# Patient Record
Sex: Male | Born: 2000 | Race: White | Hispanic: No | Marital: Single | Smoking: Never smoker
Health system: Southern US, Community
[De-identification: ages and names within clinical notes are randomized; demographics above are authoritative.]

## PROBLEM LIST (undated history)

## (undated) ENCOUNTER — Ambulatory Visit: Source: Home / Self Care

## (undated) DIAGNOSIS — H6690 Otitis media, unspecified, unspecified ear: Secondary | ICD-10-CM

## (undated) DIAGNOSIS — J302 Other seasonal allergic rhinitis: Secondary | ICD-10-CM

## (undated) DIAGNOSIS — L309 Dermatitis, unspecified: Secondary | ICD-10-CM

## (undated) HISTORY — PX: APPENDECTOMY: SHX54

## (undated) HISTORY — PX: OTHER SURGICAL HISTORY: SHX169

## (undated) HISTORY — PX: CIRCUMCISION: SHX1350

---
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
## 2022-08-20 IMAGING — US US ABDOMEN COMPLETE
1 series · 13 of 25 positions shown · non-contrast
Comparison: CT abdomen and pelvis 07/13/2015

CLINICAL DATA: Abdominal pain.  Dyspepsia.

EXAM:
ABDOMEN ULTRASOUND COMPLETE

[Series 1: us abdomen complete · 13 of 99 slices shown]
[im 1/99]
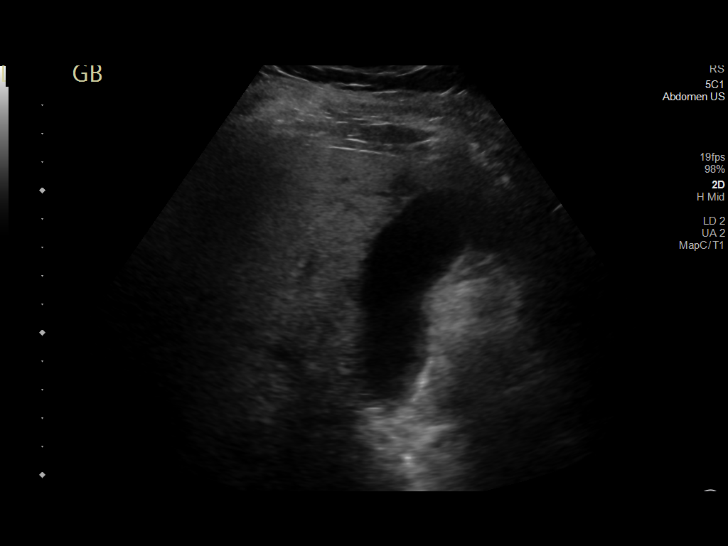
[im 9/99]
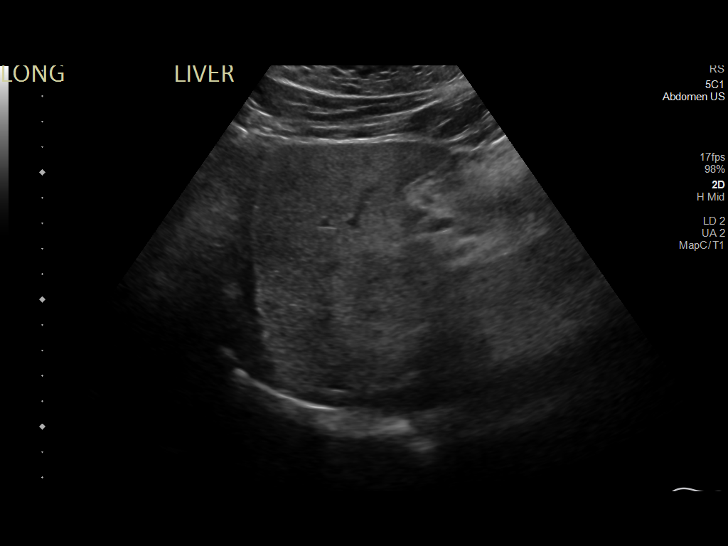
[im 17/99]
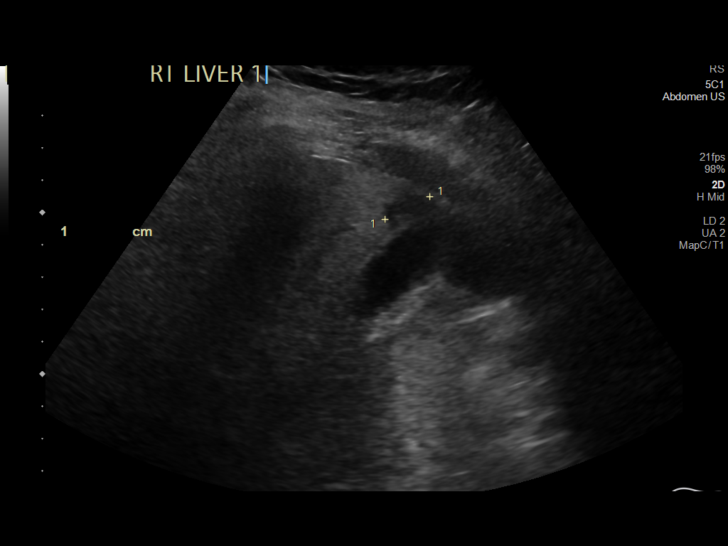
[im 25/99]
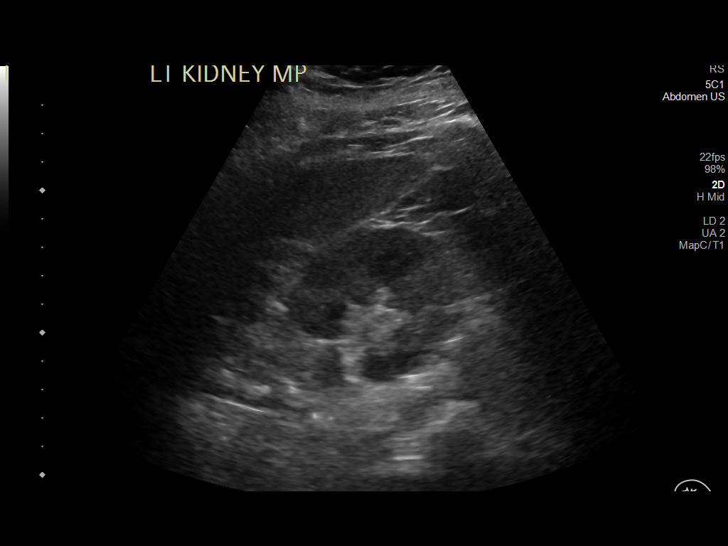
[im 33/99]
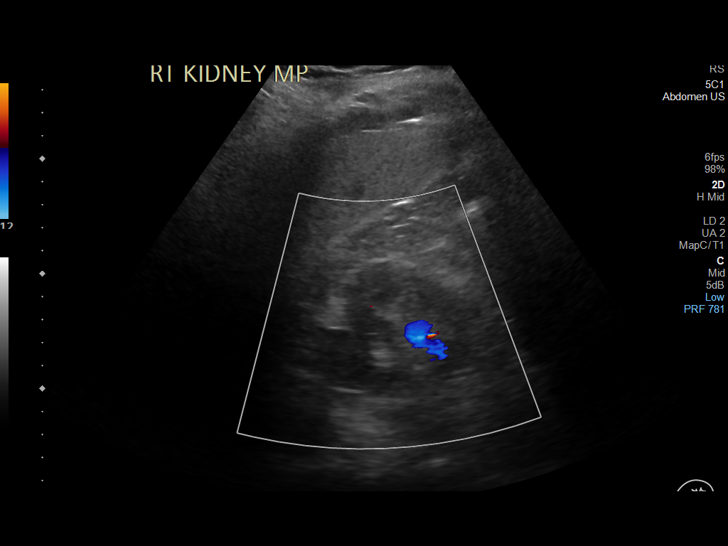
[im 41/99]
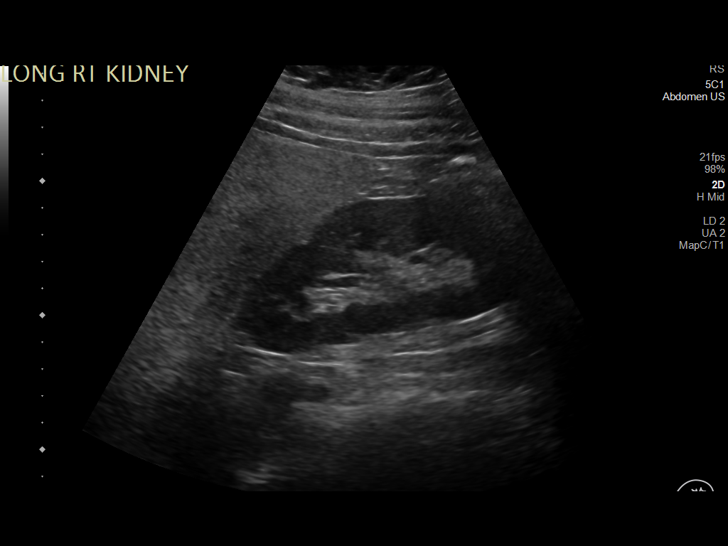
[im 50/99]
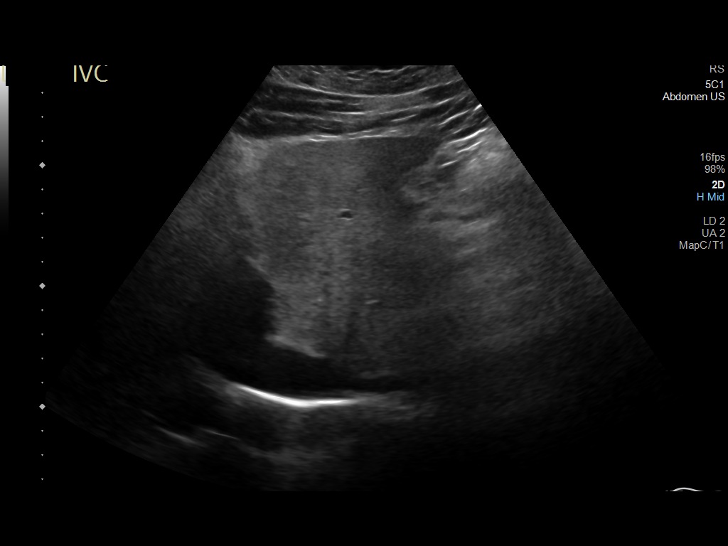
[im 58/99]
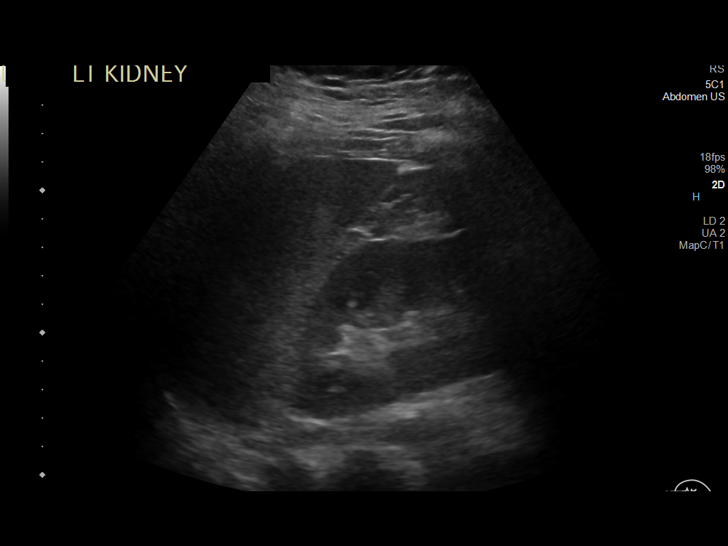
[im 66/99]
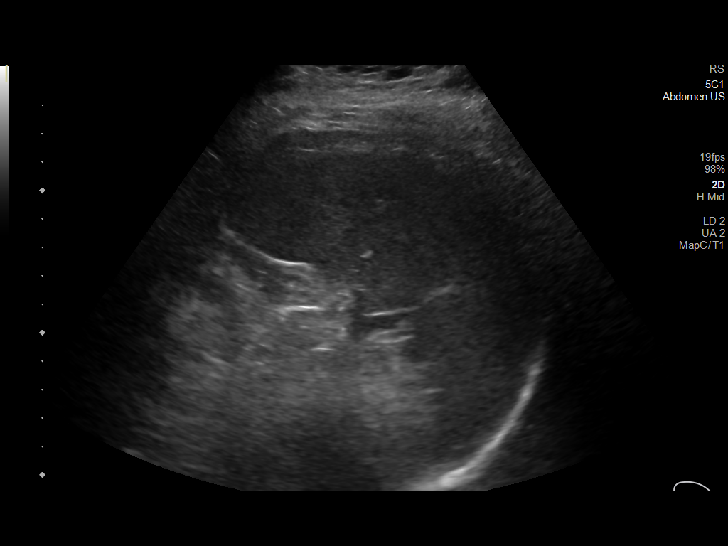
[im 74/99]
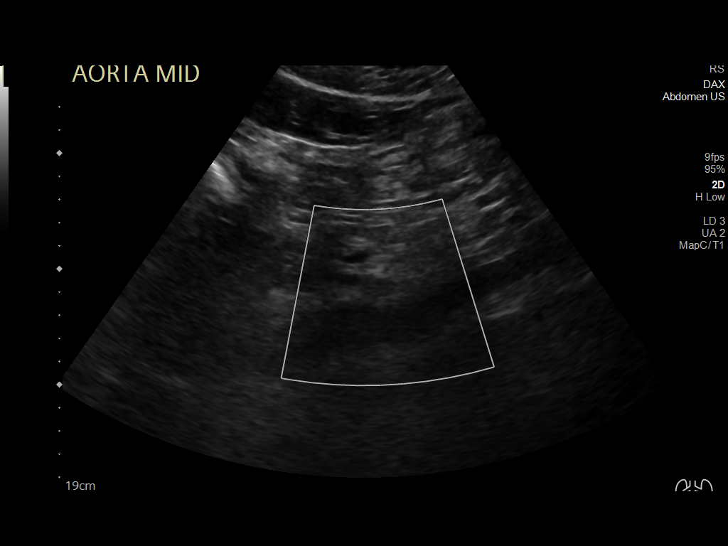
[im 82/99]
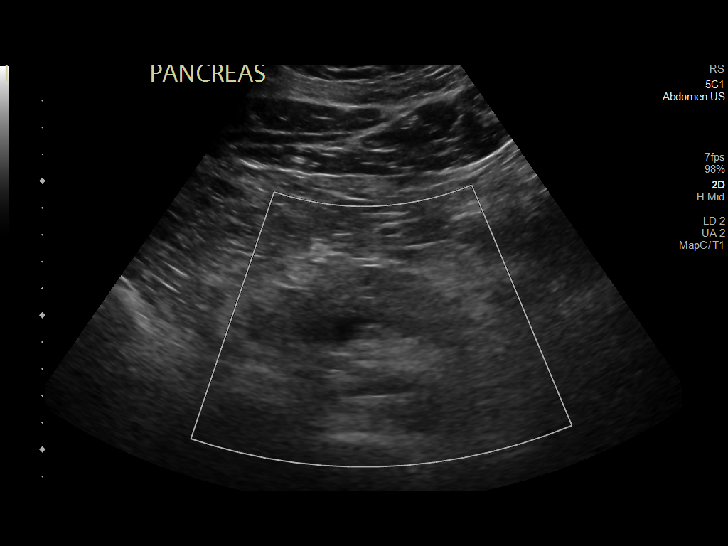
[im 90/99]
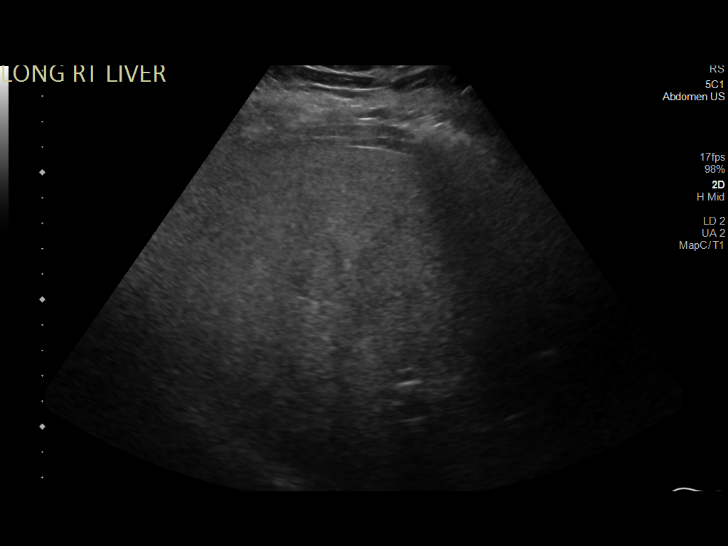
[im 99/99]
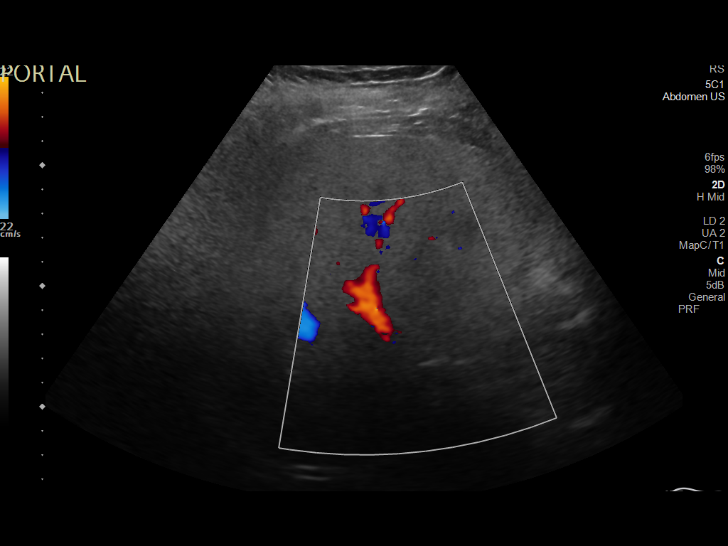

[13 of 25 positions shown; findings below may reference images not displayed]

FINDINGS: Decreased resolution due to large body habitus.

Gallbladder: No gallstones or wall thickening visualized. No
sonographic Murphy sign noted by sonographer.

Common bile duct: Diameter: 4 mm

Liver: Prominently increased parenchymal echogenicity diffusely
likely reflecting steatosis. Small hypoechoic focus adjacent to the
gallbladder without abnormal vascularity on color Doppler imaging
likely reflecting fatty sparing. Portal vein is patent on color
Doppler imaging with normal direction of blood flow towards the
liver.

IVC: Not visualized due to bowel gas.

Pancreas: Suboptimally visualized.  Visualized portion unremarkable.

Spleen: Size within normal limits. Mildly inhomogeneous echotexture
without a focal lesion identified.

Right Kidney: Length: 11.3 cm. Echogenicity within normal limits. No
mass or hydronephrosis visualized.

Left Kidney: Length: 10.8 cm. Echogenicity within normal limits. No
mass or hydronephrosis visualized.

Abdominal aorta: No aneurysm visualized. Aortic bifurcation obscured
by bowel gas.

Other findings: None.
IMPRESSION: 1. Hepatic steatosis.
2. Otherwise largely unremarkable abdominal ultrasound within above
described limitations.

## 2023-11-20 ENCOUNTER — Emergency Department (HOSPITAL_COMMUNITY)
Admission: EM | Admit: 2023-11-20 | Discharge: 2023-11-20 | Disposition: A | Discharge status: Home / Self Care | Attending: Emergency Medicine | Admitting: Emergency Medicine

## 2023-11-20 ENCOUNTER — Emergency Department (HOSPITAL_COMMUNITY)

## 2023-11-20 ENCOUNTER — Encounter (HOSPITAL_COMMUNITY): Payer: Self-pay | Admitting: Emergency Medicine

## 2023-11-20 ENCOUNTER — Other Ambulatory Visit: Payer: Self-pay

## 2023-11-20 DIAGNOSIS — R109 Unspecified abdominal pain: Secondary | ICD-10-CM | POA: Diagnosis present

## 2023-11-20 DIAGNOSIS — Z79899 Other long term (current) drug therapy: Secondary | ICD-10-CM | POA: Diagnosis not present

## 2023-11-20 DIAGNOSIS — U071 COVID-19: Secondary | ICD-10-CM | POA: Insufficient documentation

## 2023-11-20 DIAGNOSIS — R112 Nausea with vomiting, unspecified: Secondary | ICD-10-CM

## 2023-11-20 LAB — CBC
HCT: 45.3 % (ref 39.0–52.0)
Hemoglobin: 15.2 g/dL (ref 13.0–17.0)
MCH: 28.3 pg (ref 26.0–34.0)
MCHC: 33.6 g/dL (ref 30.0–36.0)
MCV: 84.2 fL (ref 80.0–100.0)
Platelets: 227 10*3/uL (ref 150–400)
RBC: 5.38 MIL/uL (ref 4.22–5.81)
RDW: 11.7 % (ref 11.5–15.5)
WBC: 5.9 10*3/uL (ref 4.0–10.5)
nRBC: 0 % (ref 0.0–0.2)

## 2023-11-20 LAB — URINALYSIS, ROUTINE W REFLEX MICROSCOPIC
Bilirubin Urine: NEGATIVE
Glucose, UA: NEGATIVE mg/dL
Hgb urine dipstick: NEGATIVE
Ketones, ur: NEGATIVE mg/dL
Leukocytes,Ua: NEGATIVE
Nitrite: NEGATIVE
Protein, ur: NEGATIVE mg/dL
Specific Gravity, Urine: 1.024 (ref 1.005–1.030)
pH: 5 (ref 5.0–8.0)

## 2023-11-20 LAB — COMPREHENSIVE METABOLIC PANEL
ALT: 59 U/L — ABNORMAL HIGH (ref 0–44)
AST: 36 U/L (ref 15–41)
Albumin: 4.4 g/dL (ref 3.5–5.0)
Alkaline Phosphatase: 45 U/L (ref 38–126)
Anion gap: 12 (ref 5–15)
BUN: 11 mg/dL (ref 6–20)
CO2: 20 mmol/L — ABNORMAL LOW (ref 22–32)
Calcium: 9.8 mg/dL (ref 8.9–10.3)
Chloride: 103 mmol/L (ref 98–111)
Creatinine, Ser: 0.77 mg/dL (ref 0.61–1.24)
GFR, Estimated: >60 mL/min (ref >60)
Glucose, Bld: 100 mg/dL — ABNORMAL HIGH (ref 70–99)
Potassium: 3.8 mmol/L (ref 3.5–5.1)
Sodium: 135 mmol/L (ref 135–145)
Total Bilirubin: 0.9 mg/dL (ref 0.0–1.2)
Total Protein: 7.8 g/dL (ref 6.5–8.1)

## 2023-11-20 LAB — SARS CORONAVIRUS 2 BY RT PCR: SARS Coronavirus 2 by RT PCR: POSITIVE — AB

## 2023-11-20 LAB — LIPASE, BLOOD: Lipase: 25 U/L (ref 11–51)

## 2023-11-20 MED ORDER — ONDANSETRON 4 MG PO TBDP: 4.0000 mg | ORAL_TABLET | Freq: Three times a day (TID) | ORAL | 0 refills | Status: DC | PRN

## 2023-11-20 MED ORDER — ONDANSETRON HCL 4 MG/2ML IJ SOLN
4.0000 mg | Freq: Once | INTRAMUSCULAR | Status: AC
  Administered 2023-11-20: 4 mg via INTRAVENOUS
  Filled 2023-11-20: qty 2

## 2023-11-20 MED ORDER — SODIUM CHLORIDE 0.9 % IV BOLUS
1000.0000 mL | Freq: Once | INTRAVENOUS | Status: AC
  Administered 2023-11-20: 1000 mL via INTRAVENOUS

## 2023-11-20 NOTE — ED Provider Notes (Signed)
 [place] EMERGENCY DEPARTMENT AT [facility] Provider Note   CSN: [id number] Arrival date & time: 11/20/23  1618    History  Chief Complaint  Patient presents with   Abdominal Pain   Emesis   Diarrhea    Gregory Warner is a 23 y.o. male here for evaluation of not feeling well.  Patient with nausea and vomiting, diarrhea which started this morning.  Has some burning in the chest after the vomiting.  Took Zofran this morning.  He is around someone who had COVID.  No hemoptysis, bloody stool.  No chest pain, shortness of breath, abd pain, weakness.  HPI     Home Medications Prior to Admission medications   Medication Sig Start Date End Date Taking? Authorizing Provider  ondansetron (ZOFRAN-ODT) 4 MG disintegrating tablet Take 1 tablet (4 mg total) by mouth every 8 (eight) hours as needed. 11/20/23  Yes Sherl Yzaguirre A, PA-C  ADVAIR DISKUS 100-50 MCG/DOSE AEPB Take 1 puff by mouth every 12 (twelve) hours. 07/03/15   [provider]  albuterol (PROVENTIL HFA;VENTOLIN HFA) 108 (90 BASE) MCG/ACT inhaler Inhale 2 puffs into the lungs every 4 (four) hours as needed for shortness of breath.     [provider]  albuterol (PROVENTIL HFA;VENTOLIN HFA) 108 (90 Base) MCG/ACT inhaler Inhale 2 puffs into the lungs every 4 (four) hours as needed for wheezing or shortness of breath. 02/18/16   Lowanda Foster, NP  cetirizine (ZYRTEC) 10 MG tablet Take 10 mg by mouth at bedtime.    [provider]  diphenhydrAMINE (BENADRYL) 25 mg capsule Take 1 capsule (25 mg total) by mouth every 6 (six) hours as needed for itching. Take 1 po q6 hours for the next 2-3 days then space out to an as needed basis Patient not taking: Reported on 07/13/2015 06/26/13   Phillis Haggis, MD  HYDROcodone-acetaminophen (NORCO/VICODIN) 5-325 MG tablet Take 1-2 tablets by mouth every 6 (six) hours as needed for moderate pain. 07/14/15   Leonia Corona, MD  ibuprofen (ADVIL,MOTRIN) 600 MG  tablet Take 1 tablet (600 mg total) by mouth every 6 (six) hours as needed for mild pain. 02/18/16   Lowanda Foster, NP  predniSONE (DELTASONE) 20 MG tablet Take 2 tablets (40 mg total) by mouth daily. Patient not taking: Reported on 07/13/2015 06/26/13   Phillis Haggis, MD      Allergies    Patient has no known allergies.    Review of Systems   Review of Systems  Constitutional: Negative.   HENT: Negative.    Respiratory: Negative.    Cardiovascular: Negative.   Gastrointestinal:  Positive for diarrhea, nausea and vomiting.  Genitourinary: Negative.   Musculoskeletal: Negative.   Skin: Negative.   Neurological: Negative.   All other systems reviewed and are negative.   Physical Exam Updated Vital Signs BP (!) 142/50   Pulse 98   Temp 98.3 F (36.8 C) (Oral)   Resp 20   SpO2 96%  Physical Exam Vitals and nursing note reviewed.  Constitutional:      General: He is not in acute distress.    Appearance: He is well-developed. He is not ill-appearing, toxic-appearing or diaphoretic.  HENT:     Head: Atraumatic.  Eyes:     Pupils: Pupils are equal, round, and reactive to light.  Cardiovascular:     Rate and Rhythm: Normal rate and regular rhythm.     Heart sounds: Normal heart sounds.  Pulmonary:     Effort: Pulmonary effort  is normal. No respiratory distress.     Breath sounds: Normal breath sounds.  Abdominal:     General: Bowel sounds are normal. There is no distension.     Palpations: Abdomen is soft.     Tenderness: There is no abdominal tenderness.     Hernia: No hernia is present.  Musculoskeletal:        General: Normal range of motion.     Cervical back: Normal range of motion and neck supple.  Skin:    General: Skin is warm and dry.  Neurological:     General: No focal deficit present.     Mental Status: He is alert and oriented to person, place, and time.     ED Results / Procedures / Treatments   Labs (all labs ordered are listed, but only abnormal  results are displayed) Labs Reviewed  SARS CORONAVIRUS 2 BY RT PCR - Abnormal; Notable for the following components:      Result Value   SARS Coronavirus 2 by RT PCR POSITIVE (*)    All other components within normal limits  COMPREHENSIVE METABOLIC PANEL - Abnormal; Notable for the following components:   CO2 20 (*)    Glucose, Bld 100 (*)    ALT 59 (*)    All other components within normal limits  LIPASE, BLOOD  CBC  URINALYSIS, ROUTINE W REFLEX MICROSCOPIC    EKG None  Radiology DG Chest Portable 1 View Result Date: 11/20/2023 CLINICAL DATA:  Chest pain.  COVID positive. EXAM: PORTABLE CHEST 1 VIEW COMPARISON:  None Available. FINDINGS: The right costophrenic angle is not entirely included in the field of view. The cardiomediastinal contours are normal. The lungs are clear. Pulmonary vasculature is normal. No consolidation, pleural effusion, or pneumothorax. No acute osseous abnormalities are seen. IMPRESSION: No acute chest findings. Electronically Signed   By: Narda Rutherford M.D.   On: 11/20/2023 21:38    Procedures Procedures    Medications Ordered in ED Medications  sodium chloride 0.9 % bolus 1,000 mL (0 mLs Intravenous Stopped 11/20/23 2053)  ondansetron (ZOFRAN) injection 4 mg (4 mg Intravenous Given 11/20/23 1906)   ED Course/ Medical Decision Making/ A&P   23 year old here for evaluation nausea, vomiting and diarrhea.  Recently around someone who had COVID.  His heart and lungs are clear.  His abdomen is soft, nontender.  No crepitus to chest wall.  No lower extremity edema.  Plan on labs, imaging and reassess  Labs and imaging personally viewed and interpreted:  UA negative for infection Lipase 25 CBC without leukocytosis Metabolic panel ALT 59 COVID-positive Chest x-ray without cardiomegaly, pulm edema, pneumothorax  Patient received IV fluids, antiemetics.  Reassessed patient.  Symptoms improved.  Tolerating p.o. intake.  I suspect his symptoms are likely  due to COVID.  Will have him follow-up outpatient, return for any worsening symptoms   The patient has been appropriately medically screened and/or stabilized in the ED. I have low suspicion for any other emergent medical condition which would require further screening, evaluation or treatment in the ED or require inpatient management.  Patient is hemodynamically stable and in no acute distress.  Patient able to ambulate in department prior to ED.  Evaluation does not show acute pathology that would require ongoing or additional emergent interventions while in the emergency department or further inpatient treatment.  I have discussed the diagnosis with the patient and answered all questions.  Pain is been managed while in the emergency department and patient has no  further complaints prior to discharge.  Patient is comfortable with plan discussed in room and is stable for discharge at this time.  I have discussed strict return precautions for returning to the emergency department.  Patient was encouraged to follow-up with PCP/specialist refer to at discharge.                                 Medical Decision Making Amount and/or Complexity of Data Reviewed External Data Reviewed: labs, radiology and notes. Labs: ordered. Decision-making details documented in ED Course. Radiology: ordered and independent interpretation performed. Decision-making details documented in ED Course.  Risk OTC drugs. Prescription drug management. Parenteral controlled substances. Decision regarding hospitalization. Diagnosis or treatment significantly limited by social determinants of health.          Final Clinical Impression(s) / ED Diagnoses Final diagnoses:  COVID  Nausea vomiting and diarrhea    Rx / DC Orders ED Discharge Orders          Ordered    ondansetron (ZOFRAN-ODT) 4 MG disintegrating tablet  Every 8 hours PRN        11/20/23 2206              Kyleena Scheirer A, PA-C 11/20/23  2209    Lorre Nick, MD 11/21/23 [phone]

## 2023-11-20 NOTE — ED Triage Notes (Addendum)
 Patient presents due to abdominal pain, vomiting and diarrhea which started about 0600 today. She also complains of burning in his chest due to the vomiting. He took Zofran around 1030 today. Patient reports being around his sister who has Covid.    EMS vitals: 144/78 BP 114 P 106 CBG 97% SPO2 on room air

## 2023-11-20 NOTE — Discharge Instructions (Addendum)
 It was a pleasure taking care of you here in the ED today.  Symptoms are likely due to COVID.  I have written for some additional Zofran.  May also use over-the-counter Imodium for diarrhea  Follow-up outpatient, return for any worsening symptoms

## 2024-02-18 ENCOUNTER — Encounter (HOSPITAL_COMMUNITY): Payer: Self-pay

## 2024-02-18 ENCOUNTER — Other Ambulatory Visit: Payer: Self-pay

## 2024-02-18 ENCOUNTER — Emergency Department (HOSPITAL_COMMUNITY)
Admission: EM | Admit: 2024-02-18 | Discharge: 2024-02-18 | Disposition: A | Discharge status: Home / Self Care | Attending: Emergency Medicine | Admitting: Emergency Medicine

## 2024-02-18 DIAGNOSIS — R112 Nausea with vomiting, unspecified: Secondary | ICD-10-CM | POA: Diagnosis present

## 2024-02-18 DIAGNOSIS — R197 Diarrhea, unspecified: Secondary | ICD-10-CM | POA: Insufficient documentation

## 2024-02-18 LAB — CBC
HCT: 49.4 % (ref 39.0–52.0)
Hemoglobin: 16.7 g/dL (ref 13.0–17.0)
MCH: 28 pg (ref 26.0–34.0)
MCHC: 33.8 g/dL (ref 30.0–36.0)
MCV: 82.7 fL (ref 80.0–100.0)
Platelets: 260 10*3/uL (ref 150–400)
RBC: 5.97 MIL/uL — ABNORMAL HIGH (ref 4.22–5.81)
RDW: 11.7 % (ref 11.5–15.5)
WBC: 7.5 10*3/uL (ref 4.0–10.5)
nRBC: 0 % (ref 0.0–0.2)

## 2024-02-18 LAB — COMPREHENSIVE METABOLIC PANEL WITH GFR
ALT: 47 U/L — ABNORMAL HIGH (ref 0–44)
AST: 31 U/L (ref 15–41)
Albumin: 4.7 g/dL (ref 3.5–5.0)
Alkaline Phosphatase: 53 U/L (ref 38–126)
Anion gap: 12 (ref 5–15)
BUN: 12 mg/dL (ref 6–20)
CO2: 22 mmol/L (ref 22–32)
Calcium: 10 mg/dL (ref 8.9–10.3)
Chloride: 104 mmol/L (ref 98–111)
Creatinine, Ser: 1.05 mg/dL (ref 0.61–1.24)
GFR, Estimated: >60 mL/min (ref >60)
Glucose, Bld: 107 mg/dL — ABNORMAL HIGH (ref 70–99)
Potassium: 3.5 mmol/L (ref 3.5–5.1)
Sodium: 138 mmol/L (ref 135–145)
Total Bilirubin: 1.1 mg/dL (ref 0.0–1.2)
Total Protein: 8.4 g/dL — ABNORMAL HIGH (ref 6.5–8.1)

## 2024-02-18 LAB — URINALYSIS, ROUTINE W REFLEX MICROSCOPIC
Bacteria, UA: NONE SEEN
Bilirubin Urine: NEGATIVE
Glucose, UA: NEGATIVE mg/dL
Hgb urine dipstick: NEGATIVE
Ketones, ur: NEGATIVE mg/dL
Leukocytes,Ua: NEGATIVE
Nitrite: NEGATIVE
Protein, ur: 30 mg/dL — AB
Specific Gravity, Urine: 1.025 (ref 1.005–1.030)
pH: 5 (ref 5.0–8.0)

## 2024-02-18 LAB — LIPASE, BLOOD: Lipase: 29 U/L (ref 11–51)

## 2024-02-18 MED ORDER — ALUM & MAG HYDROXIDE-SIMETH 200-200-20 MG/5ML PO SUSP
30.0000 mL | Freq: Once | ORAL | Status: AC
  Administered 2024-02-18: 30 mL via ORAL
  Filled 2024-02-18: qty 30

## 2024-02-18 MED ORDER — DICYCLOMINE HCL 10 MG/5ML PO SOLN
10.0000 mg | Freq: Once | ORAL | Status: AC
  Administered 2024-02-18: 10 mg via ORAL
  Filled 2024-02-18: qty 5

## 2024-02-18 MED ORDER — ONDANSETRON HCL 4 MG/2ML IJ SOLN
4.0000 mg | Freq: Once | INTRAMUSCULAR | Status: AC
  Administered 2024-02-18: 4 mg via INTRAVENOUS
  Filled 2024-02-18: qty 2

## 2024-02-18 MED ORDER — SODIUM CHLORIDE 0.9 % IV BOLUS
1000.0000 mL | Freq: Once | INTRAVENOUS | Status: AC
  Administered 2024-02-18: 1000 mL via INTRAVENOUS

## 2024-02-18 MED ORDER — ONDANSETRON 4 MG PO TBDP: ORAL_TABLET | ORAL | 0 refills | Status: DC

## 2024-02-18 NOTE — Discharge Instructions (Signed)
Try pepcid or tagamet up to twice a day.  Try to avoid things that may make this worse, most commonly these are spicy foods tomato based products fatty foods chocolate and peppermint.  Alcohol and tobacco can also make this worse.  Return to the emergency department for sudden worsening pain fever or inability to eat or drink.  

## 2024-02-18 NOTE — ED Triage Notes (Signed)
 Pt started vomiting today but last night was having abdominal pain. Denies any diarrhea. Not able to hold anything down.

## 2024-02-18 NOTE — ED Provider Notes (Signed)
 [place] EMERGENCY DEPARTMENT AT [facility] Provider Note   CSN: [id number] Arrival date & time: 02/18/24  0755     History  Chief Complaint  Patient presents with   Emesis    Gregory Warner is a 23 y.o. male.  23 yo M with a cc of n/v/d.  Going on since about 5 am.  The patient has had issues with his stomach off and on for a little while.  Says he feels like he has abdominal cramps.  Felt like it was may be gas.  Starting Gas-X and a probiotic with some mild improvement but also has been having early satiety.  He has some discomfort with eating as well.  No fevers or chills.  No suspicious food intake no known sick contacts.  No international travel.   Emesis      Home Medications Prior to Admission medications   Medication Sig Start Date End Date Taking? Authorizing Provider  ondansetron  (ZOFRAN -ODT) 4 MG disintegrating tablet 4mg  ODT q4 hours prn nausea/vomit 02/18/24  Yes Albertus Hughs, DO  ADVAIR DISKUS 100-50 MCG/DOSE AEPB Take 1 puff by mouth every 12 (twelve) hours. 07/03/15   [provider]  albuterol  (PROVENTIL  HFA;VENTOLIN  HFA) 108 (90 BASE) MCG/ACT inhaler Inhale 2 puffs into the lungs every 4 (four) hours as needed for shortness of breath.     [provider]  albuterol  (PROVENTIL  HFA;VENTOLIN  HFA) 108 (90 Base) MCG/ACT inhaler Inhale 2 puffs into the lungs every 4 (four) hours as needed for wheezing or shortness of breath. 02/18/16   Oneita Bihari, NP  cetirizine (ZYRTEC) 10 MG tablet Take 10 mg by mouth at bedtime.    [provider]  diphenhydrAMINE  (BENADRYL ) 25 mg capsule Take 1 capsule (25 mg total) by mouth every 6 (six) hours as needed for itching. Take 1 po q6 hours for the next 2-3 days then space out to an as needed basis Patient not taking: Reported on 07/13/2015 06/26/13   Felisa Hose, MD  HYDROcodone -acetaminophen  (NORCO/VICODIN) 5-325 MG tablet Take 1-2 tablets by mouth every 6 (six) hours as needed for moderate  pain. 07/14/15   Alanda Allegra, MD  ibuprofen  (ADVIL ,MOTRIN ) 600 MG tablet Take 1 tablet (600 mg total) by mouth every 6 (six) hours as needed for mild pain. 02/18/16   Oneita Bihari, NP  predniSONE  (DELTASONE ) 20 MG tablet Take 2 tablets (40 mg total) by mouth daily. Patient not taking: Reported on 07/13/2015 06/26/13   Felisa Hose, MD      Allergies    Patient has no known allergies.    Review of Systems   Review of Systems  Gastrointestinal:  Positive for vomiting.    Physical Exam Updated Vital Signs BP (!) 152/84 (BP Location: Left Arm)   Pulse 77   Temp 98.1 F (36.7 C) (Oral)   Resp 18   SpO2 100%  Physical Exam Vitals and nursing note reviewed.  Constitutional:      Appearance: He is well-developed.  HENT:     Head: Normocephalic and atraumatic.  Eyes:     Pupils: Pupils are equal, round, and reactive to light.  Neck:     Vascular: No JVD.  Cardiovascular:     Rate and Rhythm: Normal rate and regular rhythm.     Heart sounds: No murmur heard.    No friction rub. No gallop.  Pulmonary:     Effort: No respiratory distress.     Breath sounds: No wheezing.  Abdominal:  General: There is no distension.     Tenderness: There is no abdominal tenderness. There is no guarding or rebound.  Musculoskeletal:        General: Normal range of motion.     Cervical back: Normal range of motion and neck supple.  Skin:    Coloration: Skin is not pale.     Findings: No rash.  Neurological:     Mental Status: He is alert and oriented to person, place, and time.  Psychiatric:        Behavior: Behavior normal.     ED Results / Procedures / Treatments   Labs (all labs ordered are listed, but only abnormal results are displayed) Labs Reviewed  COMPREHENSIVE METABOLIC PANEL WITH GFR - Abnormal; Notable for the following components:      Result Value   Glucose, Bld 107 (*)    Total Protein 8.4 (*)    ALT 47 (*)    All other components within normal limits  CBC -  Abnormal; Notable for the following components:   RBC 5.97 (*)    All other components within normal limits  URINALYSIS, ROUTINE W REFLEX MICROSCOPIC - Abnormal; Notable for the following components:   Protein, ur 30 (*)    All other components within normal limits  LIPASE, BLOOD    EKG None  Radiology No results found.  Procedures Procedures    Medications Ordered in ED Medications  sodium chloride  0.9 % bolus 1,000 mL (1,000 mLs Intravenous New Bag/Given 02/18/24 0900)  ondansetron  (ZOFRAN ) injection 4 mg (4 mg Intravenous Given 02/18/24 0857)  alum & mag hydroxide-simeth (MAALOX/MYLANTA) 200-200-20 MG/5ML suspension 30 mL (30 mLs Oral Given 02/18/24 0920)  dicyclomine (BENTYL) 10 MG/5ML solution 10 mg (10 mg Oral Given 02/18/24 1610)    ED Course/ Medical Decision Making/ A&P                                 Medical Decision Making Amount and/or Complexity of Data Reviewed Labs: ordered.  Risk OTC drugs. Prescription drug management.   23 yo M with a chief complaints of nausea vomiting and diarrhea.  This been going on for about 3 hours.  The patient is also had some issues with intermittent abdominal discomfort has had some abdominal pain with the eating for some weeks now.  Will obtain a laboratory evaluation.  Bolus of IV fluids.  Antiemetics.  GI cocktail.  Reassess.  Patient feeling much better on repeat assessment.  Tolerating by mouth without issue.  LFT and lipase uremarkable.  No leukocytosis.  No anemia. UA negative for infection.   With weeks of symptoms may benefit by seeing GI.   9:38 AM:  I have discussed the diagnosis/risks/treatment options with the patient.  Evaluation and diagnostic testing in the emergency department does not suggest an emergent condition requiring admission or immediate intervention beyond what has been performed at this time.  They will follow up with PCP, GI. We also discussed returning to the ED immediately if new or worsening sx  occur. We discussed the sx which are most concerning (e.g., sudden worsening pain, fever, inability to tolerate by mouth) that necessitate immediate return. Medications administered to the patient during their visit and any new prescriptions provided to the patient are listed below.  Medications given during this visit Medications  sodium chloride  0.9 % bolus 1,000 mL (1,000 mLs Intravenous New Bag/Given 02/18/24 0900)  ondansetron  (ZOFRAN ) injection 4  mg (4 mg Intravenous Given 02/18/24 0857)  alum & mag hydroxide-simeth (MAALOX/MYLANTA) 200-200-20 MG/5ML suspension 30 mL (30 mLs Oral Given 02/18/24 0920)  dicyclomine (BENTYL) 10 MG/5ML solution 10 mg (10 mg Oral Given 02/18/24 1191)     The patient appears reasonably screen and/or stabilized for discharge and I doubt any other medical condition or other [facility] requiring further screening, evaluation, or treatment in the ED at this time prior to discharge.            Final Clinical Impression(s) / ED Diagnoses Final diagnoses:  Nausea vomiting and diarrhea    Rx / DC Orders ED Discharge Orders          Ordered    ondansetron  (ZOFRAN -ODT) 4 MG disintegrating tablet        02/18/24 0935              Albertus Hughs, DO 02/18/24 [id number]

## 2024-02-19 ENCOUNTER — Other Ambulatory Visit

## 2024-02-19 ENCOUNTER — Ambulatory Visit: Admitting: Gastroenterology

## 2024-02-19 ENCOUNTER — Encounter: Payer: Self-pay | Admitting: Gastroenterology

## 2024-02-19 VITALS — BP 114/74 | HR 76 | Ht 73.0 in | Wt 290.4 lb

## 2024-02-19 DIAGNOSIS — R142 Eructation: Secondary | ICD-10-CM

## 2024-02-19 DIAGNOSIS — R112 Nausea with vomiting, unspecified: Secondary | ICD-10-CM

## 2024-02-19 DIAGNOSIS — R1084 Generalized abdominal pain: Secondary | ICD-10-CM

## 2024-02-19 DIAGNOSIS — R634 Abnormal weight loss: Secondary | ICD-10-CM | POA: Diagnosis not present

## 2024-02-19 DIAGNOSIS — R1319 Other dysphagia: Secondary | ICD-10-CM

## 2024-02-19 DIAGNOSIS — R7309 Other abnormal glucose: Secondary | ICD-10-CM

## 2024-02-19 DIAGNOSIS — R194 Change in bowel habit: Secondary | ICD-10-CM | POA: Diagnosis not present

## 2024-02-19 DIAGNOSIS — R1310 Dysphagia, unspecified: Secondary | ICD-10-CM

## 2024-02-19 DIAGNOSIS — R7989 Other specified abnormal findings of blood chemistry: Secondary | ICD-10-CM

## 2024-02-19 DIAGNOSIS — R7401 Elevation of levels of liver transaminase levels: Secondary | ICD-10-CM

## 2024-02-19 MED ORDER — FAMOTIDINE 20 MG PO TABS: 20.0000 mg | ORAL_TABLET | Freq: Two times a day (BID) | ORAL | 2 refills | Status: AC

## 2024-02-19 MED ORDER — ONDANSETRON 4 MG PO TBDP: 4.0000 mg | ORAL_TABLET | Freq: Every day | ORAL | 0 refills | Status: AC | PRN

## 2024-02-19 NOTE — Patient Instructions (Signed)
 GERD diet, no late meals Start Pepcid 20 mg twice daily - take 1 tablet 30 minutes before breakfast and dinner Start eating dinner early in the day Take Zofran  hour before you head home from work.  No NSAIDS (Ibuprofen )  Marijuana cessation  Recommend considering counseling for alternative options for anxiety   High fiber diet  Start Citrucel 1 tsp po daily over the counter  Your provider has requested that you go to the basement level for lab work before leaving today. Press B on the elevator. The lab is located at the first door on the left as you exit the elevator.   You have been scheduled for an abdominal ultrasound at [facility] (1st floor of hospital) on 02/23/24 at 8:30am. Please arrive 30 minutes prior to your appointment for registration. Make certain not to have anything to eat or drink after midnight prior to your appointment. Should you need to reschedule your appointment, please contact radiology at [phone]. This test typically takes about 30 minutes to perform.   Your provider has ordered Diatherix stool testing for you. You have received a kit from our office today containing all necessary supplies to complete this test. Please carefully read the stool collection instructions provided in the kit before opening the accompanying materials. In addition, be sure there is a label providing your full name and date of birth on the puritan opti-swab tube that is supplied in the kit (if you do not see a label with this information on your test tube, please make us  aware before test collection!). After completing the test, you should secure the purtian tube into the specimen biohazard bag. The [facility] E-Req sheet (including date and time of specimen collection) should be placed into the outside pocket of the specimen biohazard bag and returned to the [facility] lab (basement floor of [facility]) within 3 days of collection. Please make sure  to give the specimen to a staff member at the lab. DO NOT leave the specimen on the counter.   If the specimen date and time (can be found in the upper right boxed portion of the sheet) are not filled out on the E-Req sheet, the test will NOT be performed.    You have been scheduled for an endoscopy. Please follow written instructions given to you at your visit today.  If you use inhalers (even only as needed), please bring them with you on the day of your procedure.  If you take any of the following medications, they will need to be adjusted prior to your procedure:   DO NOT TAKE 7 DAYS PRIOR TO TEST- Trulicity (dulaglutide) Ozempic, Wegovy (semaglutide) Mounjaro (tirzepatide) Bydureon Bcise (exanatide extended release)  DO NOT TAKE 1 DAY PRIOR TO YOUR TEST Rybelsus (semaglutide) Adlyxin (lixisenatide) Victoza (liraglutide) Byetta (exanatide) ___________________________________________________________________________  Due to recent changes in healthcare laws, you may see the results of your imaging and laboratory studies on MyChart before your provider has had a chance to review them.  We understand that in some cases there may be results that are confusing or concerning to you. Not all laboratory results come back in the same time frame and the provider may be waiting for multiple results in order to interpret others.  Please give us  48 hours in order for your provider to thoroughly review all the results before contacting the office for clarification of your results.    _______________________________________________________  If your blood pressure at your visit was 140/90 or greater,  please contact your primary care physician to follow up on this.  _______________________________________________________  If you are age 75 or older, your body mass index should be between 23-30. Your Body mass index is 38.31 kg/m. If this is out of the aforementioned range listed, please consider  follow up with your Primary Care Provider.  If you are age 65 or younger, your body mass index should be between 19-25. Your Body mass index is 38.31 kg/m. If this is out of the aformentioned range listed, please consider follow up with your Primary Care Provider.   ________________________________________________________  The [facility] providers would like to encourage you to use MYCHART to communicate with providers for non-urgent requests or questions.  Due to long hold times on the telephone, sending your provider a message by [facility] may be a faster and more efficient way to get a response.  Please allow 48 business hours for a response.  Please remember that this is for non-urgent requests.  _______________________________________________________   Thank you for trusting me with your gastrointestinal care. Deanna May, RNP

## 2024-02-19 NOTE — Progress Notes (Signed)
 Chief Complaint:ED follow-up, nausea, vomiting, and diarrhea. Primary GI Doctor: Dr. Karene Oto  HPI:  Patient is a  23  year old male patient with past medical history of asthma,anxiety,  eczema, AVM of L calf who p/w L calf pain, who was referred to me by [facility] ED on 02/18/24 for a complaint of nausea, vomiting, and diarrhea. .    02/18/24 seen in [facility] ED for nausea, vomiting, and diarrhea.LFT and lipase uremarkable. No leukocytosis. No anemia. UA negative for infection.   11/20/23 seen in ED for nausea, vomiting, and diarrhea. UA negative for infection, Lipase 25, CBC without leukocytosis, Metabolic panel ALT 59, COVID-positive, Chest x-ray without cardiomegaly, pulm edema ,pneumothorax.  Interval History   Patient reports several gastrointestinal symptoms that he states started a few months ago.  Patient denies GERD symptoms. He reports issues with esophageal dysphagia intermittently. He states he will be chewing food and when he swallows his throat tightens up. He reports it will eventually go down. He also admits to a lot of belching.     Patient reports nausea on daily basis that typically occurs in the evening after work. He will take ondansetron  prn which helps. He typically does not vomit, but states he had episode yesterday where he vomited 7 times. He reports it occurred right after waking up form his sleep. He does admit he tends to eat later in the day right beofre going to bed. Patient reports he weighed 320 lbs two months ago, and today he is 290lbs. He reports his appetite has decreased.   Patient taking ibuprofen  600 mg prn for migraines, maybe once a month.       Patient reports he has altered bowel habits where he has days he has urge to go without no bowel movement and other days he has several loose stools. He states the loose stools will sometimes wake him from his sleep.     He also reports intermittent lower abdominal cramping. No blood in stool.   No know food  triggers. Not relieved with bowel movement. He notes his symptoms [facility] increase when he has more stress. 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   Patient drinks alcohol every 2-3 mths which Gregory Warner consist of 1 shot.  Patient smokes tobacco he rolls three a day.  Patient smokes marijuana daily. He reports he started smoking due to history of anxiety and it helps with this. He tried anxiety medication in past and did not like how it made him feel.   Patient's family history includes Breast CA in Aunt, maternal grandmother with thyroid CA  Patient works at [employer] place. His hobbies include playing video games on his computer. He reports he does not engage in any regular exercise.  Surgical history- appendectomy, wisdom teeth  Wt Readings from Last 3 Encounters:  02/19/24 290 lb 6 oz (131.7 kg)  12/01/19 298 lb 1 oz (135.2 kg) (>99%, Z= 3.27)*  02/18/16 197 lb 2 oz (89.4 kg) (>99%, Z= 2.77)*   * Growth percentiles are based on CDC (Boys, 2-20 Years) data.     Past Medical History:  Diagnosis Date   Asthma    Eczema    Otitis    Seasonal allergies     Past Surgical History:  Procedure Laterality Date   APPENDECTOMY     CIRCUMCISION     LAPAROSCOPIC APPENDECTOMY N/A 07/13/2015   Procedure: APPENDECTOMY LAPAROSCOPIC;  Surgeon: Alanda Allegra, MD;  Location: [facility] OR;  Service: Pediatrics;  Laterality: N/A;   tubes in ears      Current Outpatient Medications  Medication Sig Dispense Refill   ADVAIR DISKUS 100-50 MCG/DOSE AEPB Take 1 puff by mouth every 12 (twelve) hours.  5   albuterol  (PROVENTIL  HFA;VENTOLIN  HFA) 108 (90 BASE) MCG/ACT inhaler Inhale 2 puffs into the lungs every 4 (four) hours as needed for shortness of breath.      albuterol  (PROVENTIL  HFA;VENTOLIN  HFA) 108 (90 Base) MCG/ACT inhaler Inhale 2 puffs into the lungs every 4 (four) hours as needed for wheezing or shortness of breath. 1 Inhaler 0   cetirizine (ZYRTEC) 10 MG  tablet Take 10 mg by mouth at bedtime.     diphenhydrAMINE  (BENADRYL ) 25 mg capsule Take 1 capsule (25 mg total) by mouth every 6 (six) hours as needed for itching. Take 1 po q6 hours for the next 2-3 days then space out to an as needed basis 30 capsule 0   ibuprofen  (ADVIL ,MOTRIN ) 600 MG tablet Take 1 tablet (600 mg total) by mouth every 6 (six) hours as needed for mild pain. 30 tablet 0   ondansetron  (ZOFRAN -ODT) 4 MG disintegrating tablet 4mg  ODT q4 hours prn nausea/vomit (Patient taking differently: 4 mg as needed. 4mg  ODT q4 hours prn nausea/vomit) 20 tablet 0   No current facility-administered medications for this visit.    Allergies as of 02/19/2024   (No Known Allergies)    Family History  Problem Relation Age of Onset   Arthritis Mother    Cancer Mother    Depression Mother    Hypertension Mother    Miscarriages / [place] Mother    Hyperlipidemia Father    Hypertension Father    Depression Father    Kidney disease Father    COPD Father    Asthma Father    Seizures Sister    Myasthenia gravis Sister    Depression Sister    Depression Sister    Asthma Sister     Review of Systems:    Constitutional: No weight loss, fever, chills, weakness or fatigue HEENT: Eyes: No change in vision               Ears, Nose, Throat:  No change in hearing or congestion Skin: No rash or itching Cardiovascular: No chest pain, chest pressure or palpitations   Respiratory: No SOB or cough Gastrointestinal: See HPI and otherwise negative Genitourinary: No dysuria or change in urinary frequency Neurological: No headache, dizziness or syncope Musculoskeletal: No new muscle or joint pain Hematologic: No bleeding or bruising Psychiatric: No history of depression or anxiety    Physical Exam:  Vital signs: BP 114/74   Pulse 76   Ht 6' 1 (1.854 m)   Wt 290 lb 6 oz (131.7 kg)   SpO2 98%   BMI 38.31 kg/m   Constitutional:   Pleasant male appears to be in NAD, Well developed, Well  nourished, alert and cooperative Throat: Oral cavity and pharynx without inflammation, swelling or lesion.  Respiratory: Respirations even and unlabored. Lungs clear to auscultation bilaterally.   No wheezes, crackles, or rhonchi.  Cardiovascular: Normal S1, S2. Regular rate and rhythm. No peripheral edema, cyanosis or pallor.  Gastrointestinal:  Soft, nondistended, nontender. Obese. No rebound or guarding. Normal bowel sounds. No appreciable masses or hepatomegaly. Rectal:  Not performed.  Msk:  Symmetrical without gross deformities. Without edema, no deformity or joint abnormality.  Neurologic:  Alert and  oriented x4;  grossly normal neurologically.  Skin:   Dry and intact without significant lesions or rashes. Psychiatric: Oriented to person, place and time. Demonstrates good judgement and reason without abnormal affect or behaviors.  RELEVANT LABS AND IMAGING: CBC    Latest Ref Rng & Units 02/18/2024    8:52 AM 11/20/2023    4:47 PM 07/13/2015    7:01 PM  CBC  WBC 4.0 - 10.5 K/uL 7.5  5.9  8.8   Hemoglobin 13.0 - 17.0 g/dL 16.1  09.6  04.5   Hematocrit 39.0 - 52.0 % 49.4  45.3  37.3   Platelets 150 - 400 K/uL 260  227  244      CMP     Latest Ref Rng & Units 02/18/2024    8:52 AM 11/20/2023    4:47 PM 07/13/2015    7:01 PM  CMP  Glucose 70 - 99 mg/dL 409  811  914   BUN 6 - 20 mg/dL 12  11  7    Creatinine 0.61 - 1.24 mg/dL 7.82  9.56  2.13   Sodium 135 - 145 mmol/L 138  135  141   Potassium 3.5 - 5.1 mmol/L 3.5  3.8  3.5   Chloride 98 - 111 mmol/L 104  103  104   CO2 22 - 32 mmol/L 22  20  25    Calcium 8.9 - 10.3 mg/dL 08.6  9.8  9.1   Total Protein 6.5 - 8.1 g/dL 8.4  7.8  6.7   Total Bilirubin 0.0 - 1.2 mg/dL 1.1  0.9  0.7   Alkaline Phos 38 - 126 U/L 53  45  187   AST 15 - 41 U/L 31  36  33   ALT 0 - 44 U/L 47  59  30    11/02/20 US  ABD complete for abd pain, dyspepsia MPRESSION: 1. Hepatic steatosis. 2. Otherwise largely unremarkable abdominal ultrasound within above  described limitations.  Assessment: Encounter Diagnoses  Name Primary?   Esophageal dysphagia Yes   Loss of weight    Nausea and vomiting, unspecified vomiting type    Altered bowel habits    Generalized abdominal pain    Belching    23 year old male patient with several gastrointestinal complaints he reports started few months ago. Patient has daily nausea, one episode of vomiting yesterday. No known triggers. Does smoke mariajuana daily , recommend he discontinue. Will schedule abd U/S and labwork to rule out other acute causes. Slightly elevated ALT noted on lab work. ALT elevated 47<59.Normal lipase.  His fasting glucose has also been elevated on multiple occasions, he tells me he was fasting. Also with 30lb weight loss with sedentary lifestyle. Will order h pylori diathereix to r/o h pylori. Also upper GI endoscopy with possible dilatation with Dr. Karene Oto to rule out gastritis, PUD , or esophagitis. Lab work rule out thyroid disease, diabetes, celiac disease, or inflammatory disease. Trial Pepcid 20 mg twice daily.    For the altered bowel habits will have patient follow high fiber diet with fiber supplementation. Will order lab work as listed above. EGD with small bowel biopsies.  Plan: -Order Abd US  RUQ  -Check CRP, TTG IgA, IgA, TSH, Ha1c -H pylori diathereix stool test -No NSAIDs -Ondansetron  scheduled mid day, refilled -Trial Pepcid 20 mg twice daily  -Marijuana cessation -Recommend High fiber diet  - Start Citrucel 1 tsp po daily  -Scheduled a EGD in [facility] with Dr. Karene Oto. The risks and benefits of EGD with possible biopsies and esophageal dilation were  discussed with the patient who agrees to proceed.  Thank you for the courtesy of this consult. Please call me with any questions or concerns.   Gregory Biello, [facility] 02/19/2024, 10:23 AM  Cc: Hersey Lorenzo, MD

## 2024-02-19 NOTE — Progress Notes (Signed)
 Agree with the assessment and plan as outlined by [facility], FNP-C.  Carlitos Bottino, DO, [facility]

## 2024-02-20 LAB — TSH: TSH: 1.9 m[IU]/L (ref 0.40–4.50)

## 2024-02-20 LAB — C-REACTIVE PROTEIN: CRP: <3 mg/L (ref <8.0)

## 2024-02-20 LAB — TISSUE TRANSGLUTAMINASE ABS,IGG,IGA
(tTG) Ab, IgA: <1 U/mL
(tTG) Ab, IgG: <1 U/mL

## 2024-02-20 LAB — HEMOGLOBIN A1C
Hgb A1c MFr Bld: 5.6 % (ref <5.7)
Mean Plasma Glucose: 114 mg/dL
eAG (mmol/L): 6.3 mmol/L

## 2024-02-20 LAB — IGA: Immunoglobulin A: 269 mg/dL (ref 47–310)

## 2024-02-23 ENCOUNTER — Ambulatory Visit: Payer: Self-pay | Admitting: Gastroenterology

## 2024-02-23 ENCOUNTER — Ambulatory Visit (HOSPITAL_COMMUNITY)
Admission: RE | Admit: 2024-02-23 | Discharge: 2024-02-23 | Disposition: A | Discharge status: Home / Self Care | Source: Ambulatory Visit | Attending: Gastroenterology | Admitting: Gastroenterology

## 2024-02-23 DIAGNOSIS — R1319 Other dysphagia: Secondary | ICD-10-CM | POA: Insufficient documentation

## 2024-02-23 DIAGNOSIS — R142 Eructation: Secondary | ICD-10-CM | POA: Diagnosis present

## 2024-02-23 DIAGNOSIS — R1084 Generalized abdominal pain: Secondary | ICD-10-CM | POA: Diagnosis present

## 2024-02-23 DIAGNOSIS — R7989 Other specified abnormal findings of blood chemistry: Secondary | ICD-10-CM | POA: Diagnosis present

## 2024-02-23 DIAGNOSIS — R634 Abnormal weight loss: Secondary | ICD-10-CM | POA: Insufficient documentation

## 2024-02-23 DIAGNOSIS — R194 Change in bowel habit: Secondary | ICD-10-CM | POA: Diagnosis present

## 2024-02-23 DIAGNOSIS — R112 Nausea with vomiting, unspecified: Secondary | ICD-10-CM | POA: Diagnosis present

## 2024-03-17 ENCOUNTER — Ambulatory Visit: Admitting: Gastroenterology

## 2024-03-17 ENCOUNTER — Encounter: Payer: Self-pay | Admitting: Gastroenterology

## 2024-03-17 VITALS — BP 123/54 | HR 66 | Temp 98.0°F | Resp 12

## 2024-03-17 DIAGNOSIS — R142 Eructation: Secondary | ICD-10-CM | POA: Diagnosis not present

## 2024-03-17 DIAGNOSIS — R110 Nausea: Secondary | ICD-10-CM

## 2024-03-17 DIAGNOSIS — R1319 Other dysphagia: Secondary | ICD-10-CM

## 2024-03-17 DIAGNOSIS — R1310 Dysphagia, unspecified: Secondary | ICD-10-CM

## 2024-03-17 DIAGNOSIS — R1314 Dysphagia, pharyngoesophageal phase: Secondary | ICD-10-CM | POA: Diagnosis not present

## 2024-03-17 DIAGNOSIS — R140 Abdominal distension (gaseous): Secondary | ICD-10-CM

## 2024-03-17 MED ORDER — SODIUM CHLORIDE 0.9 % IV SOLN: 500.0000 mL | Freq: Once | INTRAVENOUS | Status: AC

## 2024-03-17 NOTE — Progress Notes (Signed)
 Pt's states no medical or surgical changes since previsit or office visit.

## 2024-03-17 NOTE — Progress Notes (Signed)
 Called to room to assist during endoscopic procedure.  Patient ID and intended procedure confirmed with present staff. Received instructions for my participation in the procedure from the performing physician.

## 2024-03-17 NOTE — Patient Instructions (Signed)
 YOU HAD AN ENDOSCOPIC PROCEDURE TODAY AT THE [facility]:   Refer to the procedure report that was given to you for any specific questions about what was found during the examination.  If the procedure report does not answer your questions, please call your gastroenterologist to clarify.  If you requested that your care partner not be given the details of your procedure findings, then the procedure report has been included in a sealed envelope for you to review at your convenience later.  YOU SHOULD EXPECT: Some feelings of bloating in the abdomen. Passage of more gas than usual.  Walking can help get rid of the air that was put into your GI tract during the procedure and reduce the bloating. If you had a lower endoscopy (such as a colonoscopy or flexible sigmoidoscopy) you may notice spotting of blood in your stool or on the toilet paper. If you underwent a bowel prep for your procedure, you may not have a normal bowel movement for a few days.  Please Note:  You might notice some irritation and congestion in your nose or some drainage.  This is from the oxygen used during your procedure.  There is no need for concern and it should clear up in a day or so.  SYMPTOMS TO REPORT IMMEDIATELY:  Following upper endoscopy (EGD)  Vomiting of blood or coffee ground material  New chest pain or pain under the shoulder blades  Painful or persistently difficult swallowing  New shortness of breath  Fever of 100F or higher  Black, tarry-looking stools  For urgent or emergent issues, a gastroenterologist can be reached at any hour by calling [phone]. Do not use MyChart messaging for urgent concerns.    DIET:  We do recommend a small meal at first, but then you may proceed to your regular diet.  Drink plenty of fluids but you should avoid alcoholic beverages for 24 hours.  MEDICATIONS: Continue present medications. Resume Pepcid  as prescribed.  FOLLOW UP: Await pathology results. Return  to GI clinic as needed.  Thank you for allowing us  to provide for your healthcare needs today.  ACTIVITY:  You should plan to take it easy for the rest of today and you should NOT DRIVE or use heavy machinery until tomorrow (because of the sedation medicines used during the test).    FOLLOW UP: Our staff will call the number listed on your records the next business day following your procedure.  We will call around 7:15- 8:00 am to check on you and address any questions or concerns that you may have regarding the information given to you following your procedure. If we do not reach you, we will leave a message.     If any biopsies were taken you will be contacted by phone or by letter within the next 1-3 weeks.  Please call us  at [phone] if you have not heard about the biopsies in 3 weeks.    SIGNATURES/CONFIDENTIALITY: You and/or your care partner have signed paperwork which will be entered into your electronic medical record.  These signatures attest to the fact that that the information above on your After Visit Summary has been reviewed and is understood.  Full responsibility of the confidentiality of this discharge information lies with you and/or your care-partner.

## 2024-03-17 NOTE — Progress Notes (Signed)
 Report to PACU, RN, vss, BBS= Clear.

## 2024-03-17 NOTE — Progress Notes (Signed)
 GASTROENTEROLOGY PROCEDURE H&P NOTE   Primary Care Physician: Tonnie Raisin, MD    Reason for Procedure:   Dysphagia, belching, nausea, Bloating  Plan:    EGD  Patient is appropriate for endoscopic procedure(s) in the ambulatory ([facility]) setting.  The nature of the procedure, as well as the risks, benefits, and alternatives were carefully and thoroughly reviewed with the patient. Ample time for discussion and questions allowed. The patient understood, was satisfied, and agreed to proceed.     HPI: Gregory Warner is a 23 y.o. male who presents for EGD for evaluation of dysphagia, nausea, belching, change in stools.  Patient was most recently seen in the Gastroenterology Clinic on 02/19/2024. Labs that day n/f normal Celiac panel, TSH, A1c, CRP, CBC, CMP (ALT 47), lipase, and negative H pylori stool test.  No interval change in medical history since that appointment. Please refer to that note for full details regarding GI history and clinical presentation.   Past Medical History:  Diagnosis Date   Asthma    Eczema    Otitis    Seasonal allergies     Past Surgical History:  Procedure Laterality Date   APPENDECTOMY     CIRCUMCISION     LAPAROSCOPIC APPENDECTOMY N/A 07/13/2015   Procedure: APPENDECTOMY LAPAROSCOPIC;  Surgeon: Julietta Millman, MD;  Location: [facility] OR;  Service: Pediatrics;  Laterality: N/A;   tubes in ears      Prior to Admission medications   Medication Sig Start Date End Date Taking? Authorizing Provider  famotidine  (PEPCID ) 20 MG tablet Take 1 tablet (20 mg total) by mouth 2 (two) times daily. 02/19/24  Yes May, Deanna J, NP  ADVAIR DISKUS 100-50 MCG/DOSE AEPB Take 1 puff by mouth every 12 (twelve) hours. 07/03/15   [provider]  albuterol  (PROVENTIL  HFA;VENTOLIN  HFA) 108 (90 BASE) MCG/ACT inhaler Inhale 2 puffs into the lungs every 4 (four) hours as needed for shortness of breath.     [provider]  albuterol  (PROVENTIL  HFA;VENTOLIN   HFA) 108 (90 Base) MCG/ACT inhaler Inhale 2 puffs into the lungs every 4 (four) hours as needed for wheezing or shortness of breath. 02/18/16   Eilleen Colander, NP  cetirizine (ZYRTEC) 10 MG tablet Take 10 mg by mouth at bedtime.    [provider]  diphenhydrAMINE  (BENADRYL ) 25 mg capsule Take 1 capsule (25 mg total) by mouth every 6 (six) hours as needed for itching. Take 1 po q6 hours for the next 2-3 days then space out to an as needed basis 06/26/13   Mabe, Glendale CROME, MD  ibuprofen  (ADVIL ,MOTRIN ) 600 MG tablet Take 1 tablet (600 mg total) by mouth every 6 (six) hours as needed for mild pain. 02/18/16   Eilleen Colander, NP  ondansetron  (ZOFRAN -ODT) 4 MG disintegrating tablet Take 1 tablet (4 mg total) by mouth daily as needed for nausea or vomiting. 02/19/24   May, Deanna J, NP    Current Outpatient Medications  Medication Sig Dispense Refill   famotidine  (PEPCID ) 20 MG tablet Take 1 tablet (20 mg total) by mouth 2 (two) times daily. 60 tablet 2   ADVAIR DISKUS 100-50 MCG/DOSE AEPB Take 1 puff by mouth every 12 (twelve) hours.  5   albuterol  (PROVENTIL  HFA;VENTOLIN  HFA) 108 (90 BASE) MCG/ACT inhaler Inhale 2 puffs into the lungs every 4 (four) hours as needed for shortness of breath.      albuterol  (PROVENTIL  HFA;VENTOLIN  HFA) 108 (90 Base) MCG/ACT inhaler Inhale 2 puffs into the lungs every 4 (four) hours as  needed for wheezing or shortness of breath. 1 Inhaler 0   cetirizine (ZYRTEC) 10 MG tablet Take 10 mg by mouth at bedtime.     diphenhydrAMINE  (BENADRYL ) 25 mg capsule Take 1 capsule (25 mg total) by mouth every 6 (six) hours as needed for itching. Take 1 po q6 hours for the next 2-3 days then space out to an as needed basis 30 capsule 0   ibuprofen  (ADVIL ,MOTRIN ) 600 MG tablet Take 1 tablet (600 mg total) by mouth every 6 (six) hours as needed for mild pain. 30 tablet 0   ondansetron  (ZOFRAN -ODT) 4 MG disintegrating tablet Take 1 tablet (4 mg total) by mouth daily as needed for nausea or  vomiting. 30 tablet 0   Current Facility-Administered Medications  Medication Dose Route Frequency Provider Last Rate Last Admin   0.9 %  sodium chloride  infusion  500 mL Intravenous Once Niyam Bisping V, DO        Allergies as of 03/17/2024   (No Known Allergies)    Family History  Problem Relation Age of Onset   Arthritis Mother    Cancer Mother    Depression Mother    Hypertension Mother    Miscarriages / [place] Mother    Hyperlipidemia Father    Hypertension Father    Depression Father    Kidney disease Father    COPD Father    Asthma Father    Seizures Sister    Myasthenia gravis Sister    Depression Sister    Depression Sister    Asthma Sister     Social History   Socioeconomic History   Marital status: Single    Spouse name: Not on file   Number of children: Not on file   Years of education: Not on file   Highest education level: Not on file  Occupational History   Not on file  Tobacco Use   Smoking status: Passive Smoke Exposure - Never Smoker   Smokeless tobacco: Not on file  Substance and Sexual Activity   Alcohol use: Not on file   Drug use: Yes    Types: Marijuana   Sexual activity: Not on file  Other Topics Concern   Not on file  Social History Narrative   Lives with Curvin Bamberger, Mom, 2 Sisters. 1 dog, fish, stray cat. Mom smokes outside; Gpa smokes cigars occasionally.   Social Drivers of Corporate investment banker Strain: Not on File (12/27/2021)   Received from [employer] [facility]    Financial Resource Strain: 0  Food Insecurity: Not at Risk (03/15/2024)   Received from [facility]   Food Insecurity    Within the past 12 months, the food you bought just didn't last and you didn't have enough money to get more.: 1  Transportation Needs: Not at Risk (03/15/2024)   Received from [facility] Needs    In the past 12 months, has lack of transportation kept you from medical appointments, meetings, work or from getting  things needed for daily living? (Check all that apply): 1  Physical Activity: Not on File (12/27/2021)   Received from [facility]   Physical Activity    Physical Activity: 0  Stress: Not on File (12/27/2021)   Received from [facility]   Stress    Stress: 0  Social Connections: Not on File (05/24/2023)   Received from [facility]   Social Connections    Connectedness: 0  Intimate Partner Violence: Not on file    Physical Exam:  Vital signs in last 24 hours: @Temp  98 F (36.7 C)  GEN: NAD EYE: Sclerae anicteric ENT: MMM CV: Non-tachycardic Pulm: CTA b/l GI: Soft, NT/ND NEURO:  Alert & Oriented x 3   Sandor Flatter, DO [facility]   03/17/2024 8:40 AM

## 2024-03-17 NOTE — Op Note (Signed)
 [facility] Patient Name: Gregory Warner Procedure Date: 03/17/2024 9:17 AM MRN: [id number] Endoscopist: Sandor Flatter , MD, [id number] Age: 23 Referring MD:  Date of Birth: [birth year] Gender: Male Account #: [id number] Procedure:                Upper GI endoscopy Indications:              Dysphagia, Abdominal bloating, Eructation, Nausea                           Overall clinical improvement since starting Pepcid                             last month.                           Serologic evaluation otherwise unremarkable last                            month. Medicines:                Monitored Anesthesia Care Procedure:                Pre-Anesthesia Assessment:                           - Prior to the procedure, a History and Physical                            was performed, and patient medications and                            allergies were reviewed. The patient's tolerance of                            previous anesthesia was also reviewed. The risks                            and benefits of the procedure and the sedation                            options and risks were discussed with the patient.                            All questions were answered, and informed consent                            was obtained. Prior Anticoagulants: The patient has                            taken no anticoagulant or antiplatelet agents. ASA                            Grade Assessment: II - A patient with mild systemic  disease. After reviewing the risks and benefits,                            the patient was deemed in satisfactory condition to                            undergo the procedure.                           After obtaining informed consent, the endoscope was                            passed under direct vision. Throughout the                            procedure, the patient's blood pressure, pulse, and                            oxygen  saturations were monitored continuously. The                            GIF [id number] #[id number] was introduced through the                            mouth, and advanced to the third part of duodenum.                            The upper GI endoscopy was accomplished without                            difficulty. The patient tolerated the procedure                            well. Scope In: Scope Out: Findings:                 The examined esophagus was normal.                           The Z-line was regular and was found 40 cm from the                            incisors.                           The entire examined stomach was normal. Biopsies                            were taken with a cold forceps for histology and                            Helicobacter pylori testing. Estimated blood loss                            was minimal.  The examined duodenum was normal. Biopsies were                            taken with a cold forceps for histology. Estimated                            blood loss was minimal. Complications:            No immediate complications. Estimated Blood Loss:     Estimated blood loss was minimal. Impression:               - Normal esophagus.                           - Z-line regular, 40 cm from the incisors.                           - Normal stomach. Biopsied.                           - Normal examined duodenum. Biopsied. Recommendation:           - Patient has a contact number available for                            emergencies. The signs and symptoms of potential                            delayed complications were discussed with the                            patient. Return to normal activities tomorrow.                            Written discharge instructions were provided to the                            patient.                           - Resume previous diet.                           - Resume Pepcid  as prescribed.                            - Continue present medications.                           - Await pathology results.                           - Return to GI clinic PRN. Sandor Flatter, MD 03/17/2024 9:34:13 AM

## 2024-03-18 ENCOUNTER — Telehealth: Payer: Self-pay

## 2024-03-18 NOTE — Telephone Encounter (Signed)
  Follow up Call-     03/17/2024    8:26 AM  Call back number  Post procedure Call Back phone  # [phone]  Permission to leave phone message Yes     Patient questions:  Do you have a fever, pain , or abdominal swelling? No. Pain Score  0 *  Have you tolerated food without any problems? Yes.    Have you been able to return to your normal activities? Yes.    Do you have any questions about your discharge instructions: Diet   No. Medications  No. Follow up visit  No.  Do you have questions or concerns about your Care? No.  Actions: * If pain score is 4 or above: No action needed, pain <4.

## 2024-03-19 LAB — SURGICAL PATHOLOGY

## 2024-03-22 ENCOUNTER — Ambulatory Visit: Payer: Self-pay | Admitting: Gastroenterology
